# Patient Record
Sex: Male | Born: 1960 | Race: White | Hispanic: No | Marital: Married | Smoking: Never smoker
Health system: Southern US, Community
[De-identification: ages and names within clinical notes are randomized; demographics above are authoritative.]

## PROBLEM LIST (undated history)

## (undated) DIAGNOSIS — N201 Calculus of ureter: Secondary | ICD-10-CM

---

## 2007-09-28 ENCOUNTER — Emergency Department: Payer: Self-pay | Admitting: Unknown Physician Specialty

## 2008-05-22 ENCOUNTER — Emergency Department: Payer: Self-pay | Admitting: Emergency Medicine

## 2009-10-06 IMAGING — CT CT ABD-PELV W/O CM
1 of 2 series · 16 of 32 positions shown, 20 images · non-contrast
Comparison: none

REASON FOR EXAM: Left flank pain
COMMENTS:

PROCEDURE:     CT  - CT ABDOMEN AND PELVIS W[DATE] [DATE]
RESULT:     The patient has a history of flank pain.
TECHNIQUE: Nonenhanced CT of abdomen and pelvis is obtained.
Comparison is made to a prior study of 09/28/2007.

[Series 2: soft tissue · axial · 0.67mm/px · z∈[-1212,-786]mm · 16 of 154 slices shown, 20 images]
[im 6/154  soft-tissue]
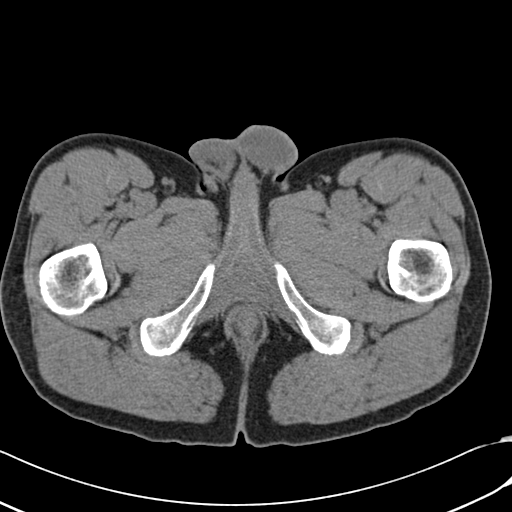
[im 6/154  bone]
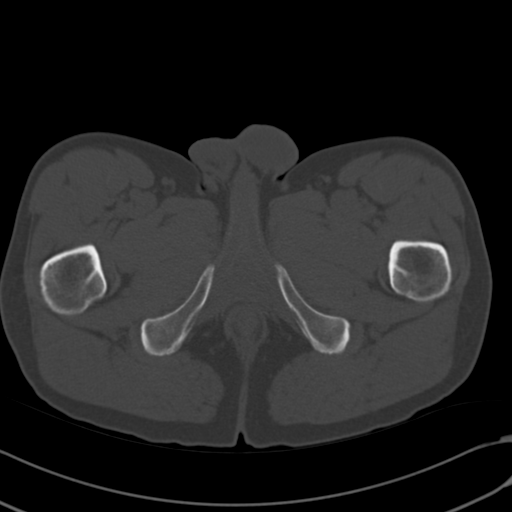
[im 18/154  soft-tissue]
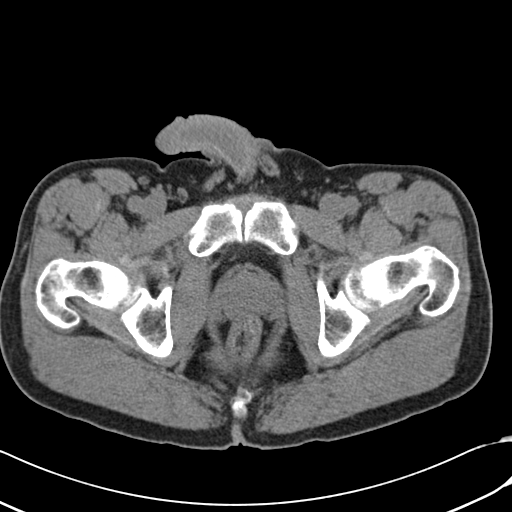
[im 29/154  soft-tissue]
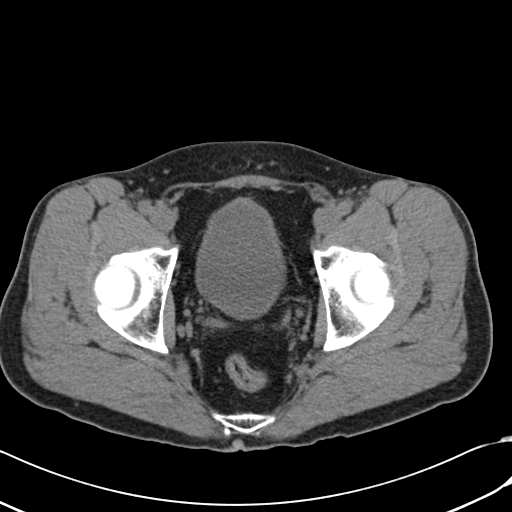
[im 40/154  soft-tissue]
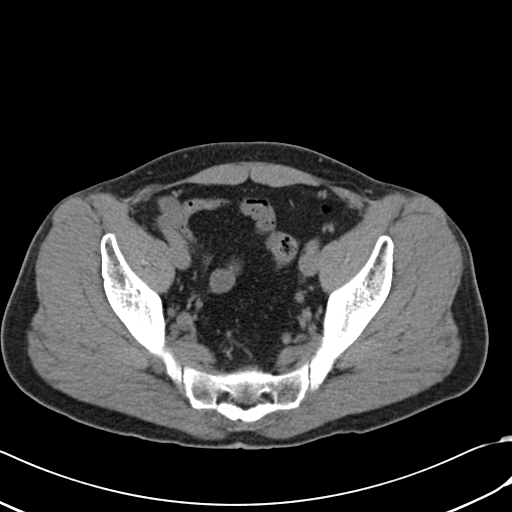
[im 52/154  soft-tissue]
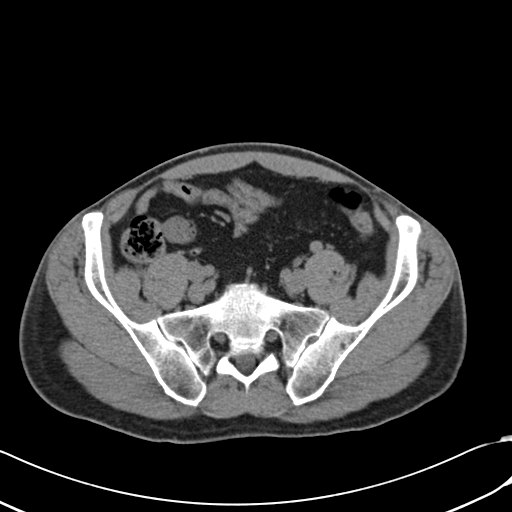
[im 63/154  soft-tissue]
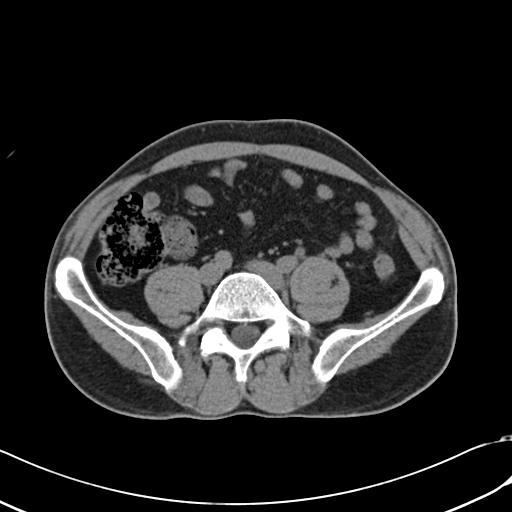
[im 74/154  soft-tissue]
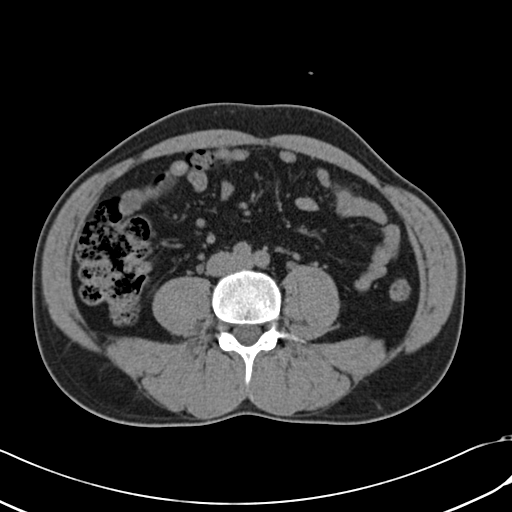
[im 80/154  soft-tissue]
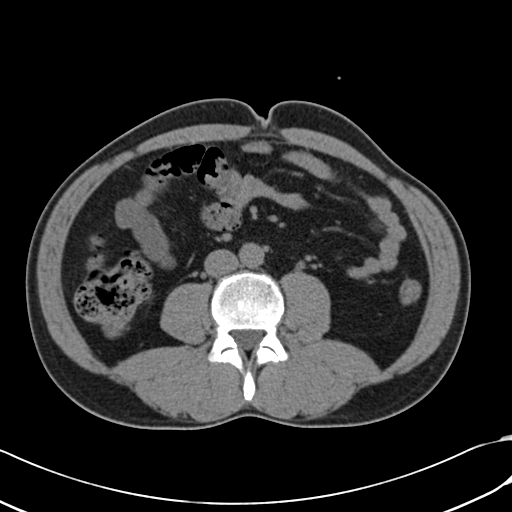
[im 91/154  soft-tissue]
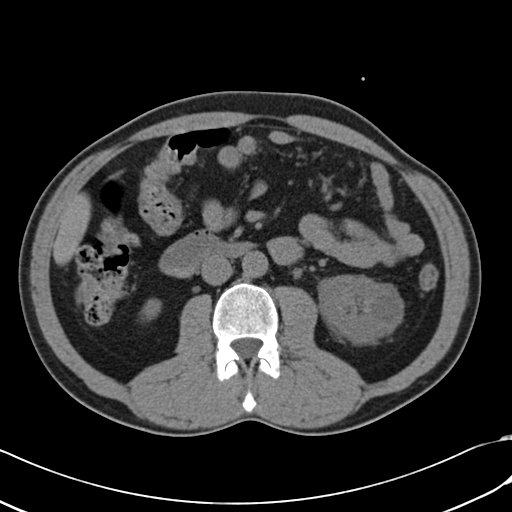
[im 91/154  bone]
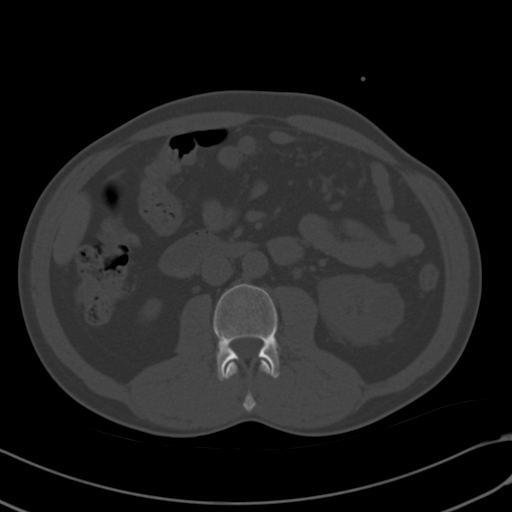
[im 103/154  soft-tissue]
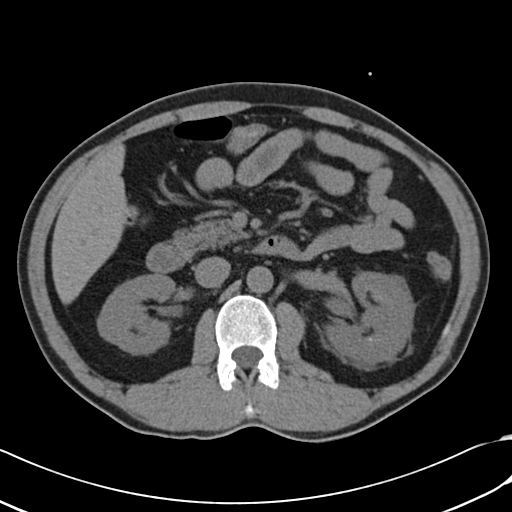
[im 114/154  soft-tissue]
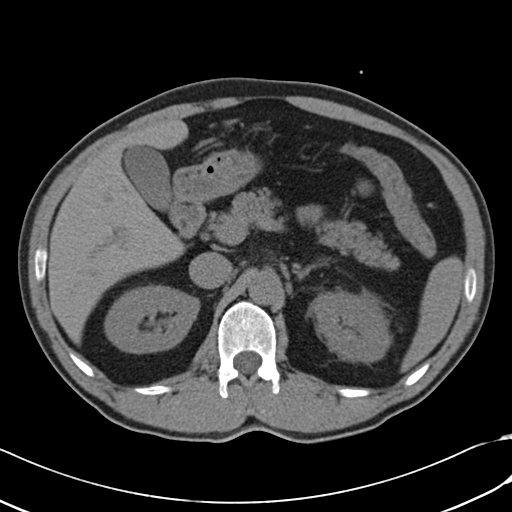
[im 125/154  soft-tissue]
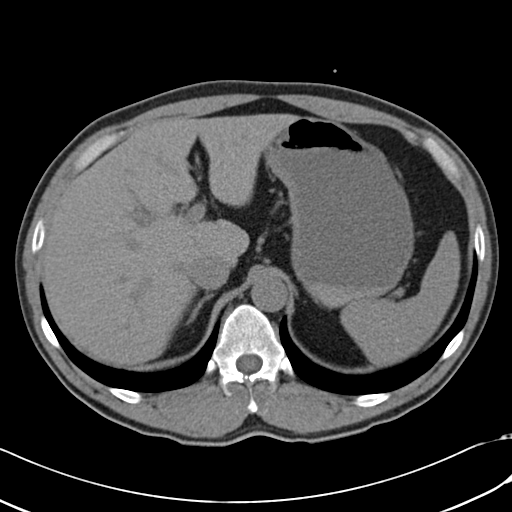
[im 131/154  lung]
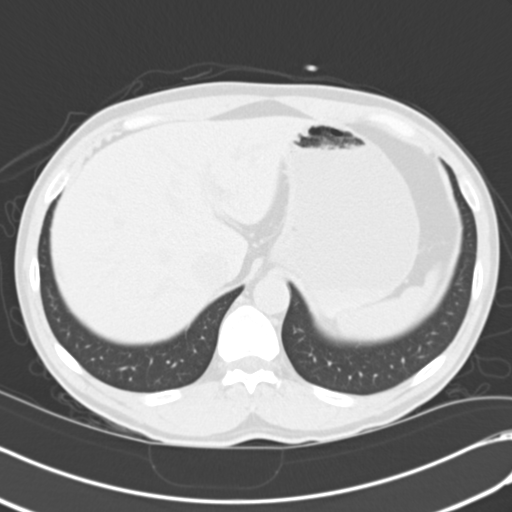
[im 137/154  soft-tissue]
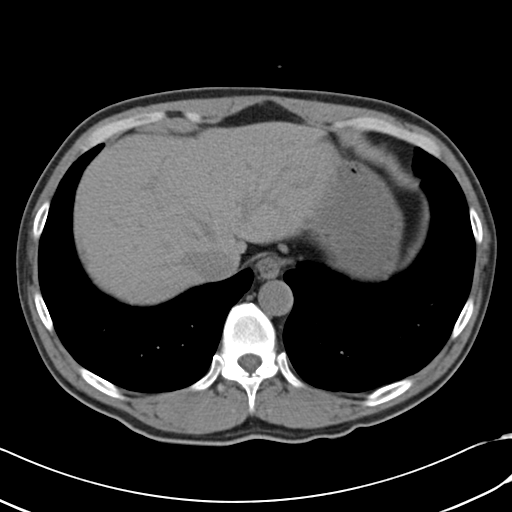
[im 137/154  lung]
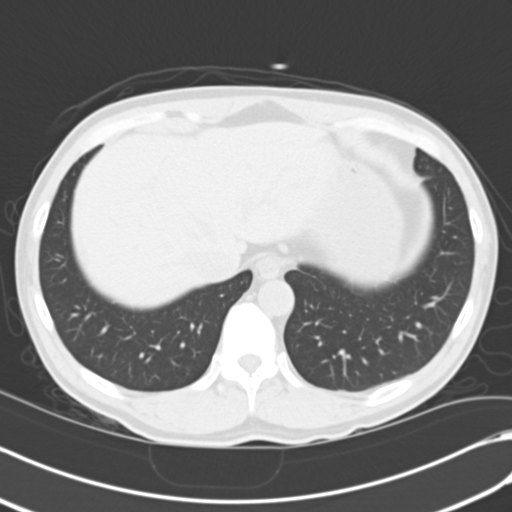
[im 142/154  lung]
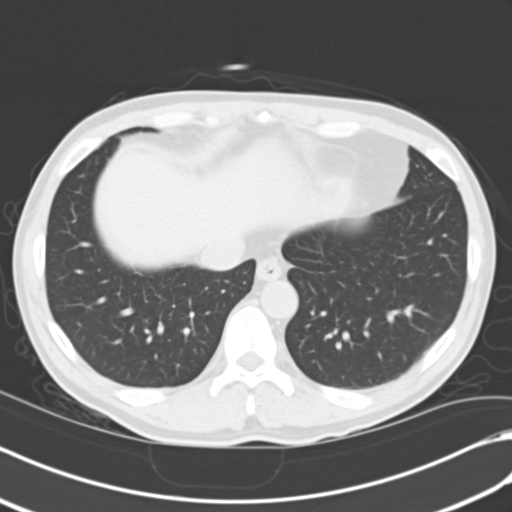
[im 148/154  soft-tissue]
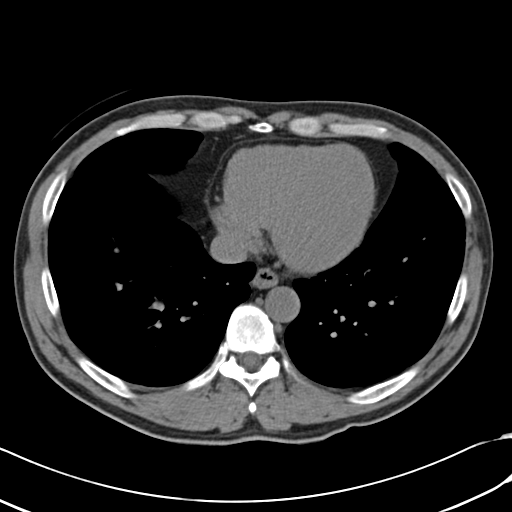
[im 148/154  lung]
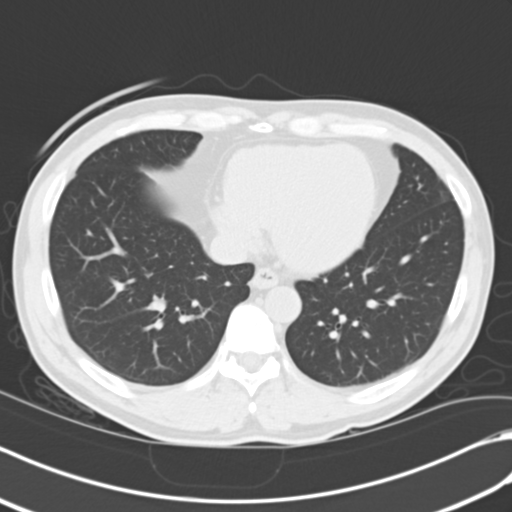

[16 of 32 positions shown; findings below may reference images not displayed]

FINDINGS: The liver and spleen are normal. The pancreas is normal. The
gallbladder is nondistended. The adrenals are normal. The RIGHT kidney is
unremarkable. There is LEFT hydronephrosis and hydroureter to the level of
the ureterovesical junction at which point there is a tiny, sand-like,
mm stone. Perirenal mild streaking is noted on the LEFT. A simple, LEFT
renal cyst is noted. This is present on a prior study of 09/28/2007. The
abdominal aorta is unremarkable. The appendiceal region is normal. A
calcified granuloma on the RIGHT is noted.
IMPRESSION: Tiny, 1.0 to 2.0 mm stone in the distal, LEFT ureter at the
LEFT ureterovesical junction.

## 2010-02-04 ENCOUNTER — Ambulatory Visit: Payer: Self-pay | Admitting: Internal Medicine

## 2016-08-08 ENCOUNTER — Observation Stay
Admission: EM | Admit: 2016-08-08 | Discharge: 2016-08-08 | Disposition: A | Discharge status: Home / Self Care | Payer: Medicaid Other | Attending: Internal Medicine | Admitting: Internal Medicine

## 2016-08-08 ENCOUNTER — Encounter: Payer: Self-pay | Admitting: *Deleted

## 2016-08-08 ENCOUNTER — Emergency Department: Payer: Medicaid Other

## 2016-08-08 DIAGNOSIS — D72829 Elevated white blood cell count, unspecified: Secondary | ICD-10-CM | POA: Diagnosis not present

## 2016-08-08 DIAGNOSIS — R1110 Vomiting, unspecified: Secondary | ICD-10-CM

## 2016-08-08 DIAGNOSIS — R109 Unspecified abdominal pain: Secondary | ICD-10-CM

## 2016-08-08 DIAGNOSIS — N201 Calculus of ureter: Secondary | ICD-10-CM | POA: Diagnosis present

## 2016-08-08 DIAGNOSIS — N179 Acute kidney failure, unspecified: Secondary | ICD-10-CM | POA: Diagnosis not present

## 2016-08-08 DIAGNOSIS — Z882 Allergy status to sulfonamides status: Secondary | ICD-10-CM | POA: Insufficient documentation

## 2016-08-08 DIAGNOSIS — N132 Hydronephrosis with renal and ureteral calculous obstruction: Secondary | ICD-10-CM | POA: Diagnosis not present

## 2016-08-08 DIAGNOSIS — Z87442 Personal history of urinary calculi: Secondary | ICD-10-CM | POA: Insufficient documentation

## 2016-08-08 DIAGNOSIS — E872 Acidosis, unspecified: Secondary | ICD-10-CM

## 2016-08-08 HISTORY — DX: Calculus of ureter: N20.1

## 2016-08-08 LAB — CBC
HEMATOCRIT: 47.3 % (ref 40.0–52.0)
HEMOGLOBIN: 16.4 g/dL (ref 13.0–18.0)
MCH: 30.4 pg (ref 26.0–34.0)
MCHC: 34.7 g/dL (ref 32.0–36.0)
MCV: 87.5 fL (ref 80.0–100.0)
Platelets: 272 10*3/uL (ref 150–440)
RBC: 5.41 MIL/uL (ref 4.40–5.90)
RDW: 12.3 % (ref 11.5–14.5)
WBC: 17.8 10*3/uL — AB (ref 3.8–10.6)

## 2016-08-08 LAB — URINALYSIS COMPLETE WITH MICROSCOPIC (ARMC ONLY)
Bacteria, UA: NONE SEEN
Bilirubin Urine: NEGATIVE
GLUCOSE, UA: NEGATIVE mg/dL
Hgb urine dipstick: NEGATIVE
Leukocytes, UA: NEGATIVE
Nitrite: NEGATIVE
PROTEIN: NEGATIVE mg/dL
SPECIFIC GRAVITY, URINE: 1.011 (ref 1.005–1.030)
SQUAMOUS EPITHELIAL / LPF: NONE SEEN
pH: 8 (ref 5.0–8.0)

## 2016-08-08 LAB — COMPREHENSIVE METABOLIC PANEL
ALK PHOS: 81 U/L (ref 38–126)
ALT: 14 U/L — AB (ref 17–63)
AST: 26 U/L (ref 15–41)
Albumin: 4.7 g/dL (ref 3.5–5.0)
Anion gap: 11 (ref 5–15)
BUN: 24 mg/dL — AB (ref 6–20)
CALCIUM: 9.7 mg/dL (ref 8.9–10.3)
CO2: 21 mmol/L — AB (ref 22–32)
CREATININE: 1.6 mg/dL — AB (ref 0.61–1.24)
Chloride: 102 mmol/L (ref 101–111)
GFR calc non Af Amer: 47 mL/min — ABNORMAL LOW (ref >60)
GFR, EST AFRICAN AMERICAN: 55 mL/min — AB (ref >60)
Glucose, Bld: 122 mg/dL — ABNORMAL HIGH (ref 65–99)
Potassium: 4 mmol/L (ref 3.5–5.1)
SODIUM: 134 mmol/L — AB (ref 135–145)
Total Bilirubin: 1.1 mg/dL (ref 0.3–1.2)
Total Protein: 8 g/dL (ref 6.5–8.1)

## 2016-08-08 MED ORDER — ONDANSETRON HCL 4 MG/2ML IJ SOLN: 4.0000 mg | Freq: Four times a day (QID) | INTRAMUSCULAR | Status: DC | PRN

## 2016-08-08 MED ORDER — ACETAMINOPHEN 500 MG PO TABS
1000.0000 mg | ORAL_TABLET | Freq: Once | ORAL | Status: AC
  Administered 2016-08-08: 1000 mg via ORAL
  Filled 2016-08-08: qty 2

## 2016-08-08 MED ORDER — ACETAMINOPHEN 650 MG RE SUPP: 650.0000 mg | Freq: Four times a day (QID) | RECTAL | Status: DC | PRN

## 2016-08-08 MED ORDER — SODIUM CHLORIDE 0.9 % IV BOLUS (SEPSIS)
1000.0000 mL | Freq: Once | INTRAVENOUS | Status: AC
  Administered 2016-08-08: 1000 mL via INTRAVENOUS

## 2016-08-08 MED ORDER — MORPHINE SULFATE (PF) 2 MG/ML IV SOLN: 2.0000 mg | INTRAVENOUS | Status: DC | PRN

## 2016-08-08 MED ORDER — ENOXAPARIN SODIUM 40 MG/0.4ML ~~LOC~~ SOLN: 30.0000 mg | SUBCUTANEOUS | Status: DC

## 2016-08-08 MED ORDER — DOCUSATE SODIUM 100 MG PO CAPS: 100.0000 mg | ORAL_CAPSULE | Freq: Two times a day (BID) | ORAL | Status: DC

## 2016-08-08 MED ORDER — PROMETHAZINE HCL 12.5 MG PO TABS: 12.5000 mg | ORAL_TABLET | Freq: Four times a day (QID) | ORAL | 0 refills | Status: AC | PRN

## 2016-08-08 MED ORDER — ACETAMINOPHEN 325 MG PO TABS: 650.0000 mg | ORAL_TABLET | Freq: Four times a day (QID) | ORAL | Status: DC | PRN

## 2016-08-08 MED ORDER — POLYETHYLENE GLYCOL 3350 17 G PO PACK: 17.0000 g | PACK | Freq: Every day | ORAL | Status: DC | PRN

## 2016-08-08 MED ORDER — HYDROMORPHONE HCL 1 MG/ML IJ SOLN
1.0000 mg | INTRAMUSCULAR | Status: DC | PRN
  Administered 2016-08-08: 1 mg via INTRAVENOUS
  Filled 2016-08-08: qty 1

## 2016-08-08 MED ORDER — OXYCODONE HCL 5 MG PO TABS: 5.0000 mg | ORAL_TABLET | Freq: Three times a day (TID) | ORAL | 0 refills | Status: AC | PRN

## 2016-08-08 MED ORDER — MORPHINE SULFATE (PF) 4 MG/ML IV SOLN
4.0000 mg | INTRAVENOUS | Status: DC | PRN
  Administered 2016-08-08: 4 mg via INTRAVENOUS
  Filled 2016-08-08: qty 1

## 2016-08-08 MED ORDER — PROMETHAZINE HCL 25 MG RE SUPP: 25.0000 mg | Freq: Four times a day (QID) | RECTAL | 0 refills | Status: DC | PRN

## 2016-08-08 MED ORDER — PROMETHAZINE HCL 25 MG/ML IJ SOLN
12.5000 mg | Freq: Once | INTRAMUSCULAR | Status: AC
  Administered 2016-08-08: 12.5 mg via INTRAVENOUS
  Filled 2016-08-08: qty 1

## 2016-08-08 MED ORDER — ALBUTEROL SULFATE (2.5 MG/3ML) 0.083% IN NEBU: 2.5000 mg | INHALATION_SOLUTION | RESPIRATORY_TRACT | Status: DC | PRN

## 2016-08-08 MED ORDER — HYDROCODONE-ACETAMINOPHEN 5-325 MG PO TABS: 1.0000 | ORAL_TABLET | ORAL | Status: DC | PRN

## 2016-08-08 MED ORDER — ACETAMINOPHEN 10 MG/ML IV SOLN: 1000.0000 mg | Freq: Four times a day (QID) | INTRAVENOUS | Status: DC

## 2016-08-08 MED ORDER — ONDANSETRON HCL 4 MG PO TABS: 4.0000 mg | ORAL_TABLET | Freq: Four times a day (QID) | ORAL | Status: DC | PRN

## 2016-08-08 MED ORDER — TAMSULOSIN HCL 0.4 MG PO CAPS: 0.4000 mg | ORAL_CAPSULE | Freq: Every day | ORAL | 0 refills | Status: DC

## 2016-08-08 MED ORDER — ONDANSETRON HCL 4 MG/2ML IJ SOLN
4.0000 mg | Freq: Once | INTRAMUSCULAR | Status: AC
  Administered 2016-08-08: 4 mg via INTRAVENOUS
  Filled 2016-08-08: qty 2

## 2016-08-08 MED ORDER — SENNA 8.6 MG PO TABS: 1.0000 | ORAL_TABLET | Freq: Two times a day (BID) | ORAL | Status: DC

## 2016-08-08 NOTE — ED Provider Notes (Addendum)
[facility] Emergency Department Provider Note    First MD Initiated Contact with Patient 08/08/16 [phone]     (approximate)  I have reviewed the triage vital signs and the nursing notes.   HISTORY  Chief Complaint Flank Pain    HPI Samuel Kelley is a 55 y.o. male with reported history of kidney stones presents with acute onset constant left crampy flank pain that started last night at 7 PM while he was at home. Denies any fevers. Has had nausea and vomiting associated with this pain. States that the pain does not migrate in her left flank. He has not noticed any rash. He denies any shortness of breath or chest pain. Denies any pain in his legs. Denies any diarrhea or blood in stools.   PMH: kidney stones  There are no active problems to display for this patient.   No past surgical history.  Prior to Admission medications   Not on File    Allergies Sulfa antibiotics  FMH: no bleeding disorders  Social History Social History  Substance Use Topics  . Smoking status: Not on file  . Smokeless tobacco: Not on file  . Alcohol use Not on file    Review of Systems Patient denies headaches, rhinorrhea, blurry vision, numbness, shortness of breath, chest pain, edema, cough, abdominal pain, nausea, vomiting, diarrhea, dysuria, fevers, rashes or hallucinations unless otherwise stated above in HPI. ____________________________________________   PHYSICAL EXAM:  VITAL SIGNS: Vitals:   08/08/16 0831  BP: (!) 140/91  Pulse: 78  Resp: 18  Temp: 98.8 F (37.1 C)    Constitutional: Alert and oriented.ill appearing Eyes: Conjunctivae are normal. PERRL. EOMI. Head: Atraumatic. Nose: No congestion/rhinnorhea. Mouth/Throat: Mucous membranes are moist.  Oropharynx non-erythematous. Neck: No stridor. Painless ROM. No cervical spine tenderness to palpation Hematological/Lymphatic/Immunilogical: No cervical lymphadenopathy. Cardiovascular: Normal  rate, regular rhythm. Grossly normal heart sounds.  Good peripheral circulation. Respiratory: Normal respiratory effort.  No retractions. Lungs CTAB. Gastrointestinal: Soft and nontender. No distention. No abdominal bruits. Genitourinary: left CVA ttpMusculoskeletal: No lower extremity tenderness nor edema.  No joint effusions. Neurologic:  Normal speech and language. No gross focal neurologic deficits are appreciated. No gait instability. Skin:  Skin is warm, dry and intact. No rash noted. Psychiatric: Mood and affect are normal. Speech and behavior are normal.  ____________________________________________   LABS (all labs ordered are listed, but only abnormal results are displayed)  Results for orders placed or performed during the hospital encounter of 08/08/16 (from the past 24 hour(s))  CBC     Status: Abnormal   Collection Time: 08/08/16  8:31 AM  Result Value Ref Range   WBC 17.8 (H) 3.8 - 10.6 K/uL   RBC 5.41 4.40 - 5.90 MIL/uL   Hemoglobin 16.4 13.0 - 18.0 g/dL   HCT 13.0 86.5 - 78.4 %   MCV 87.5 80.0 - 100.0 fL   MCH 30.4 26.0 - 34.0 pg   MCHC 34.7 32.0 - 36.0 g/dL   RDW 69.6 29.5 - 28.4 %   Platelets 272 150 - 440 K/uL  Comprehensive metabolic panel     Status: Abnormal   Collection Time: 08/08/16  8:31 AM  Result Value Ref Range   Sodium 134 (L) 135 - 145 mmol/L   Potassium 4.0 3.5 - 5.1 mmol/L   Chloride 102 101 - 111 mmol/L   CO2 21 (L) 22 - 32 mmol/L   Glucose, Bld 122 (H) 65 - 99 mg/dL   BUN 24 (H) 6 -  20 mg/dL   Creatinine, Ser 1.611.60 (H) 0.61 - 1.24 mg/dL   Calcium 9.7 8.9 - 09.610.3 mg/dL   Total Protein 8.0 6.5 - 8.1 g/dL   Albumin 4.7 3.5 - 5.0 g/dL   AST 26 15 - 41 U/L   ALT 14 (L) 17 - 63 U/L   Alkaline Phosphatase 81 38 - 126 U/L   Total Bilirubin 1.1 0.3 - 1.2 mg/dL   GFR calc non Af Amer 47 (L) >60 mL/min   GFR calc Af Amer 55 (L) >60 mL/min   Anion gap 11 5 - 15  Urinalysis complete, with microscopic ([facility] only)     Status: Abnormal   Collection  Time: 08/08/16 10:03 AM  Result Value Ref Range   Color, Urine STRAW (A) YELLOW   APPearance CLEAR (A) CLEAR   Glucose, UA NEGATIVE NEGATIVE mg/dL   Bilirubin Urine NEGATIVE NEGATIVE   Ketones, ur 1+ (A) NEGATIVE mg/dL   Specific Gravity, Urine 1.011 1.005 - 1.030   Hgb urine dipstick NEGATIVE NEGATIVE   pH 8.0 5.0 - 8.0   Protein, ur NEGATIVE NEGATIVE mg/dL   Nitrite NEGATIVE NEGATIVE   Leukocytes, UA NEGATIVE NEGATIVE   RBC / HPF 0-5 0 - 5 RBC/hpf   WBC, UA 0-5 0 - 5 WBC/hpf   Bacteria, UA NONE SEEN NONE SEEN   Squamous Epithelial / LPF NONE SEEN NONE SEEN   ____________________________________________   ____________________________________________  RADIOLOGY  CT abdomen with 2mm obstructing left UVJ stone ____________________________________________   PROCEDURES  Procedure(s) performed: none    Critical Care performed: no ____________________________________________   INITIAL IMPRESSION / ASSESSMENT AND PLAN / ED COURSE  Pertinent labs & imaging results that were available during my care of the patient were reviewed by me and considered in my medical decision making (see chart for details).  DDX: stone, pyelo, colitis, msk strain  Samuel Kelley is a 55 y.o. who presents to the ED with acute left flank pain associated with nausea vomiting. Patient. Uncomfortable appearing. His abdominal exam is soft and benign. Based on his history of stones will order laboratory evaluation as well as CT imaging to evaluate for acute obstructive uropathy.  Clinical Course  Comment By Time  Patient reassessed. Patient states pain is improved. Discussed results with patient. Does have marked leukocytosis. Also with evidence of a KI that appears to be post obstructive uropathy. Patient also with a metabolic acidosis. Will order another IV fluid bolus bolus for resuscitation. Willy EddyPatrick Gaby Harney, MD 08/26 [phone]  Patient reassessed and requesting to be discharged home.  Dr Elpidio AnisSudini and  I wanted to bedside to evaluate patient. Currently hemodynamic stable and afebrile. His symptoms have improved as far as pain but the patient is still complaining of nausea. Further recommended admission to the hospital patient declined. Patient demonstrated understanding that his condition could worsen including worsening pain worsening renal renal function worsening dehydration. Patient was encouraged to return to the ER should he return request further evaluation and assistance.  Have discussed with the patient and available family all diagnostics and treatments performed thus far and all questions were answered to the best of my ability. The patient demonstrates understanding and agreement with plan.  Willy EddyPatrick Rawlins Stuard, MD 08/26 1148   ----------------------------------------- 10:57 AM on 08/08/2016 -----------------------------------------  Patient reassessed and still having nausea and vomiting. We'll reduce antinausea medications. Patient ordered an additional IV fluid bolus. His urine does not show evidence of infection. Based on presentation do feel patient will require admission for further  symptomatic management and treatment of his acute kidney injury.  ____________________________________________   FINAL CLINICAL IMPRESSION(S) / ED DIAGNOSES  Final diagnoses:  Flank pain  Ureterolithiasis  Intractable vomiting with nausea, vomiting of unspecified type  AKI (acute kidney injury) (HCC)  Metabolic acidosis      NEW MEDICATIONS STARTED DURING THIS VISIT:  New Prescriptions   No medications on file     Note:  This document was prepared using Dragon voice recognition software and may include unintentional dictation errors.    Willy Eddy, MD 08/08/16 1058    Willy Eddy, MD 08/08/16 1150

## 2016-08-08 NOTE — H&P (Signed)
[facility] [place] [facility]   PATIENT NAME: Samuel ArRodney Carlton    MR#:  [id number]  DATE OF BIRTH:  12-Nov-1961  DATE OF ADMISSION:  08/08/2016  PRIMARY CARE PHYSICIAN: No PCP Per Patient   REQUESTING/REFERRING PHYSICIAN: Dr. Roxan Hockeyobinson  CHIEF COMPLAINT:   Chief Complaint  Patient presents with  . Flank Pain    HISTORY OF PRESENT ILLNESS:  Samuel Kelley  is a 55 y.o. male with a known history of Ureteral stones 3 years back presents to the emergency room complaining of one week off on and off left flank and back spasmodic pain which has acutely worsened in the last 24 hours. He has been unable to keep any food or pills down. Afebrile. Urinalysis shows no UTI. Acute kidney injury. CT scan of the abdomen showed left UVJ 2 mm stone with left hydronephrosis.  Patient is being admitted for IV fluids, IV pain medications and antiemetics. Unable to keep pills down.  PAST MEDICAL HISTORY:   Past Medical History:  Diagnosis Date  . Left ureteral stone     PAST SURGICAL HISTORY:  No past surgical history on file.  SOCIAL HISTORY:   Social History  Substance Use Topics  . Smoking status: Not on file  . Smokeless tobacco: Not on file  . Alcohol use Not on file   Doesn't smoke or drink alcohol.  FAMILY HISTORY:  History reviewed. No pertinent family history.  No DM, CAD DRUG ALLERGIES:   Allergies  Allergen Reactions  . Sulfa Antibiotics Other (See Comments)    Severe abdominal pain.    REVIEW OF SYSTEMS:   ROS  MEDICATIONS AT HOME:   Prior to Admission medications   Not on File     VITAL SIGNS:  Blood pressure (!) 140/91, pulse 78, temperature 98.8 F (37.1 C), temperature source Oral, resp. rate 18, height 5\' 8"  (1.727 m), weight 68.9 kg (152 lb), SpO2 100 %.  PHYSICAL EXAMINATION:  Physical Exam  GENERAL:  55 y.o.-year-old patient lying in the bed with no acute distress.  EYES: Pupils equal, round, reactive to light and  accommodation. No scleral icterus. Extraocular muscles intact.  HEENT: Head atraumatic, normocephalic. Oropharynx and nasopharynx clear. No oropharyngeal erythema, dry oral mucosa  NECK:  Supple, no jugular venous distention. No thyroid enlargement, no tenderness.  LUNGS: Normal breath sounds bilaterally, no wheezing, rales, rhonchi. No use of accessory muscles of respiration.  CARDIOVASCULAR: S1, S2 normal. No murmurs, rubs, or gallops.  ABDOMEN: Soft, nondistended. Bowel sounds present. No organomegaly or mass. Left flank and CVA tenderness EXTREMITIES: No pedal edema, cyanosis, or clubbing. + 2 pedal & radial pulses b/l.   NEUROLOGIC: Cranial nerves II through XII are intact. No focal Motor or sensory deficits appreciated b/l PSYCHIATRIC: The patient is alert and oriented x 3. Good affect.  SKIN: No obvious rash, lesion, or ulcer.   LABORATORY PANEL:   CBC  Recent Labs Lab 08/08/16 0831  WBC 17.8*  HGB 16.4  HCT 47.3  PLT 272   ------------------------------------------------------------------------------------------------------------------  Chemistries   Recent Labs Lab 08/08/16 0831  NA 134*  K 4.0  CL 102  CO2 21*  GLUCOSE 122*  BUN 24*  CREATININE 1.60*  CALCIUM 9.7  AST 26  ALT 14*  ALKPHOS 81  BILITOT 1.1   ------------------------------------------------------------------------------------------------------------------  Cardiac Enzymes No results for input(s): TROPONINI in the last 168 hours. ------------------------------------------------------------------------------------------------------------------  RADIOLOGY:  Ct Renal Stone Study  Result Date: 08/08/2016 CLINICAL DATA:  Left-sided pain beginning last  night EXAM: CT ABDOMEN AND PELVIS WITHOUT CONTRAST TECHNIQUE: Multidetector CT imaging of the abdomen and pelvis was performed following the standard protocol without IV contrast. COMPARISON:  05/22/2008 FINDINGS: Lower chest: Lung bases are clear.  No effusions. Heart is normal size. Hepatobiliary: No focal hepatic abnormality. Gallbladder unremarkable. Pancreas: No focal abnormality or ductal dilatation. Spleen: Scattered calcifications compatible with old granulomatous disease. Normal size. Adrenals/Urinary Tract: Mild to moderate left hydronephrosis. Extensive perinephric stranding on the left. This is due to a 2 mm left UVJ stone. No stones or hydronephrosis on the right. Urinary bladder and adrenal glands have an unremarkable appearance. Stomach/Bowel: Few scattered left colonic diverticula. No active diverticulitis. Stomach and small bowel decompressed, unremarkable. Appendix is normal. Vascular/Lymphatic: No evidence of aneurysm or adenopathy. Reproductive: No visible focal abnormality. Other: No free fluid or free air. Musculoskeletal: No acute bony abnormality or focal bone lesion. IMPRESSION: 2 mm left UVJ stone with moderate left hydronephrosis and extensive perinephric stranding. Electronically Signed   By: Charlett Nose M.D.   On: 08/08/2016 09:20     IMPRESSION AND PLAN:   * Left UVJ 2 mm stone with hydronephrosis Patient does have mild elevation of creatinine which could be acute kidney injury or CKD. The stone will likely pass with symptomatic treatment and IV fluids. Patient has needed multiple doses of IV antiemetics and chronic pain medications for symptom control. Admit for IV fluids and IV pain medications. Consult urology. Urinalysis shows no signs of UTI  * Acute kidney injury Start IV fluids. Monitor input and output. Repeat labs in the morning.  * Leukocytosis Likely from stress and hemoconcentration. We will repeat labs in the morning after hydration.  * DVT prophylaxis with Lovenox  All the records are reviewed and case discussed with ED provider. Management plans discussed with the patient, family and they are in agreement.  CODE STATUS: FULL CODE  TOTAL TIME TAKING CARE OF THIS PATIENT: 40 minutes.    Milagros Loll R M.D on 08/08/2016 at 11:13 AM  Between 7am to 6pm - Pager - [phone]  After 6pm go to [email or web address] - password [email or web address] [facility]  [facility]  Office  [phone]  CC: Primary care physician; No PCP Per Patient  Note: This dictation was prepared with Dragon dictation along with smaller phrase technology. Any transcriptional errors that result from this process are unintentional.

## 2016-08-08 NOTE — ED Notes (Signed)
EDP at bedside  

## 2016-08-08 NOTE — ED Triage Notes (Signed)
States vomiting and left flank pain that began last night, states hx of kidney stones, states sense of urinary urgency

## 2017-12-23 IMAGING — CT CT RENAL STONE PROTOCOL
2 of 4 series · 16 of 46 positions shown, 18 images · non-contrast
Comparison: 05/22/2008

CLINICAL DATA: Left-sided pain beginning last night

EXAM:
CT ABDOMEN AND PELVIS WITHOUT CONTRAST
TECHNIQUE: Multidetector CT imaging of the abdomen and pelvis was performed
following the standard protocol without IV contrast.

[Series 2: axial st · axial · 0.69mm/px · z∈[-986,-591]mm · 13 of 87 slices shown, 15 images]
[im 4/87  soft-tissue]
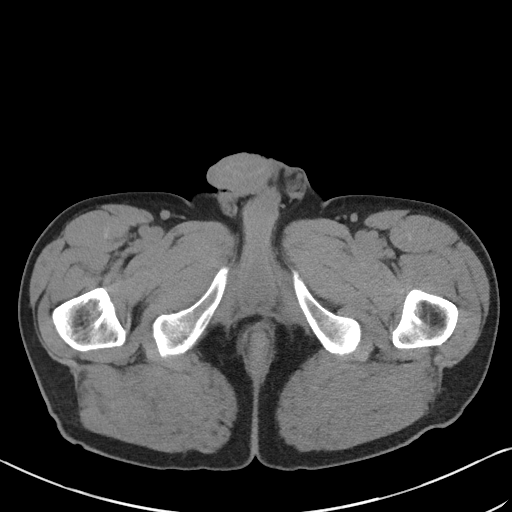
[im 4/87  bone]
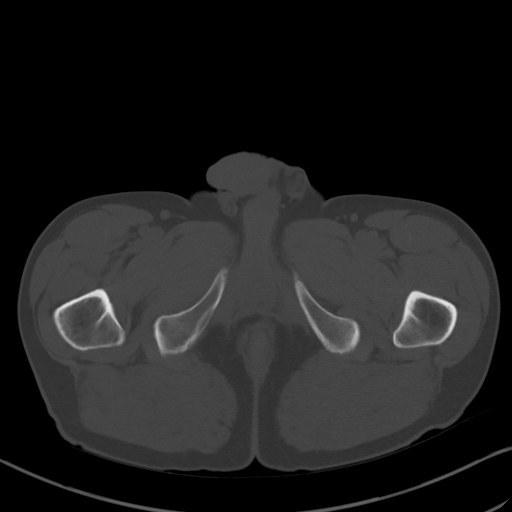
[im 11/87  soft-tissue]
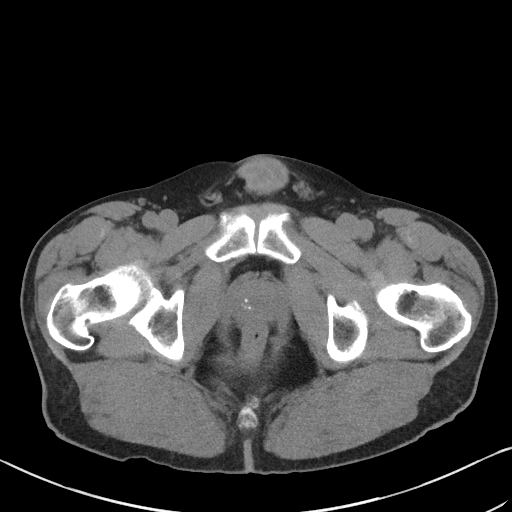
[im 18/87  soft-tissue]
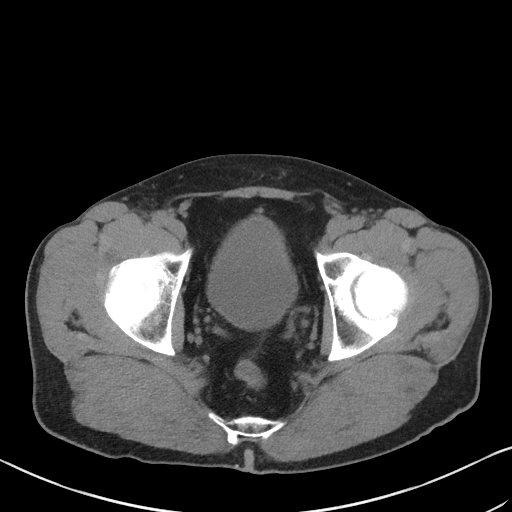
[im 25/87  soft-tissue]
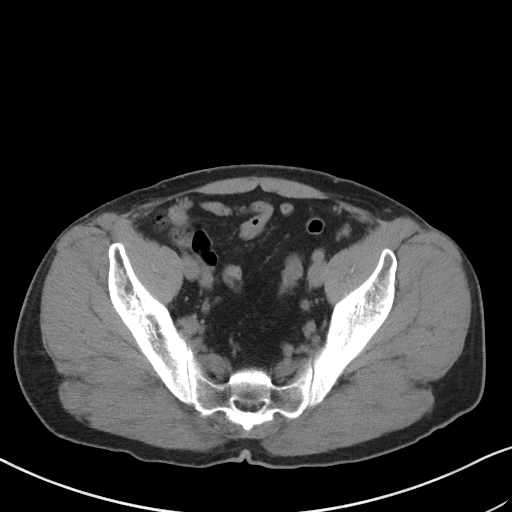
[im 31/87  soft-tissue]
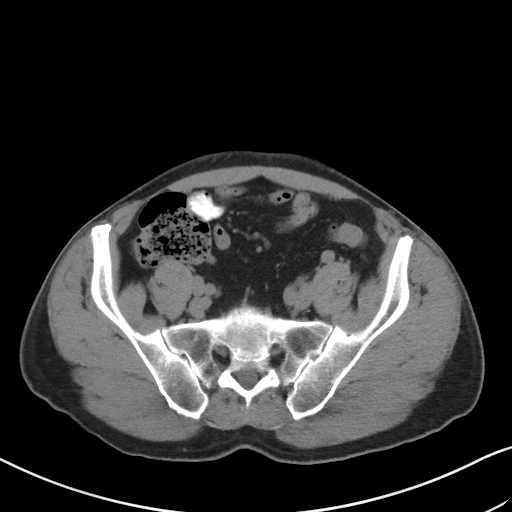
[im 38/87  soft-tissue]
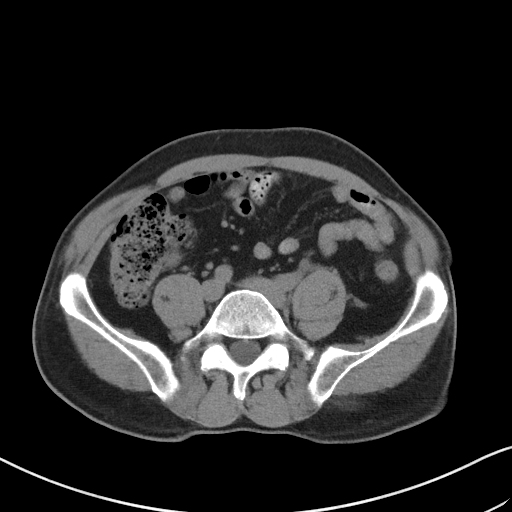
[im 45/87  soft-tissue]
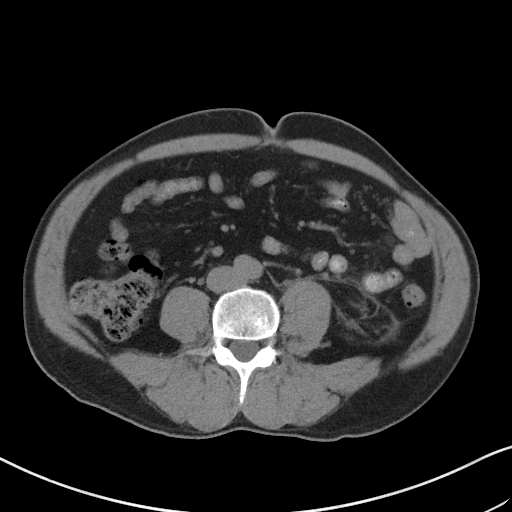
[im 49/87  soft-tissue]
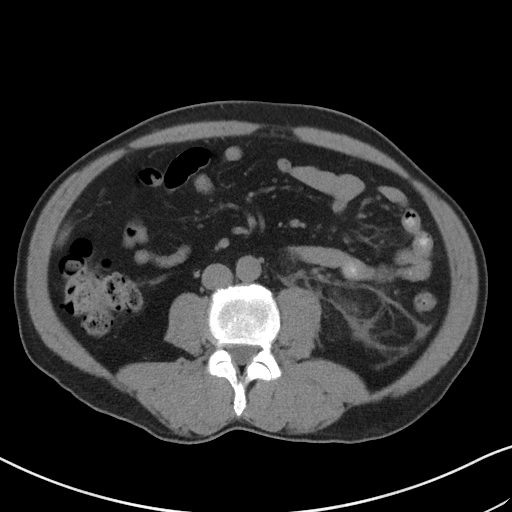
[im 56/87  soft-tissue]
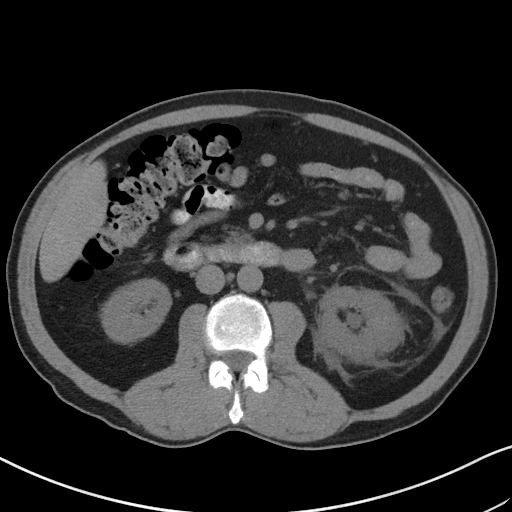
[im 56/87  bone]
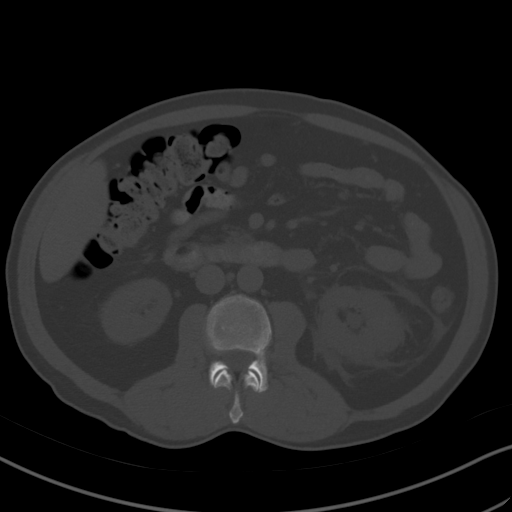
[im 62/87  soft-tissue]
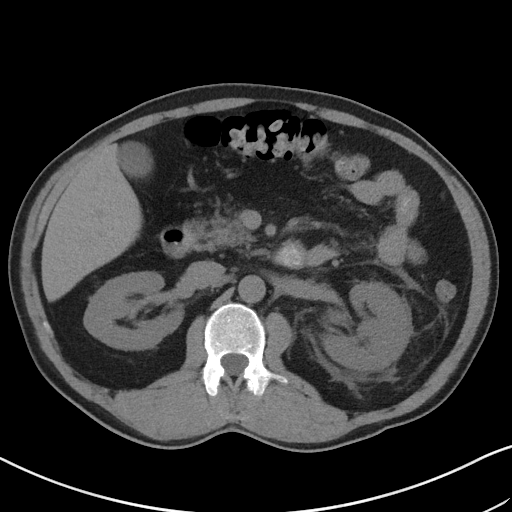
[im 69/87  soft-tissue]
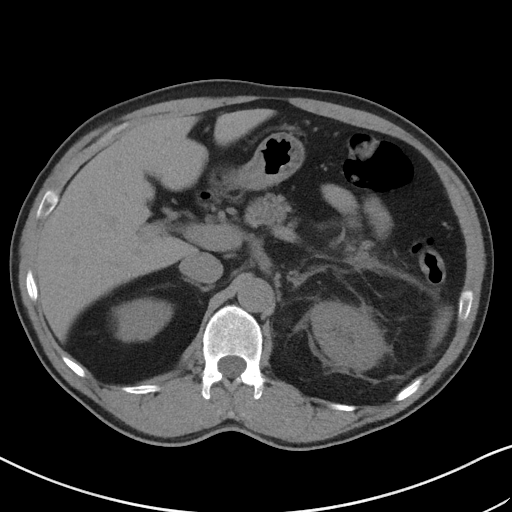
[im 76/87  soft-tissue]
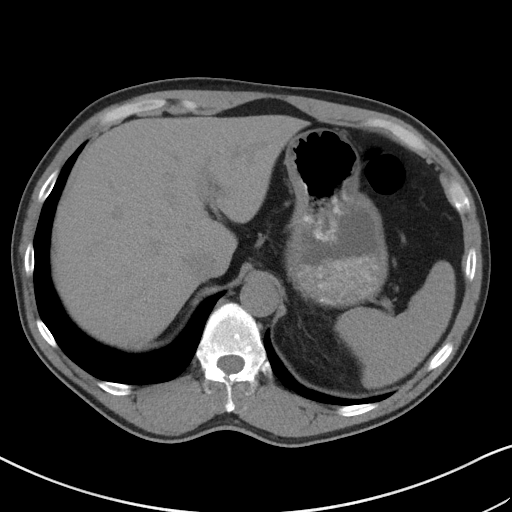
[im 83/87  soft-tissue]
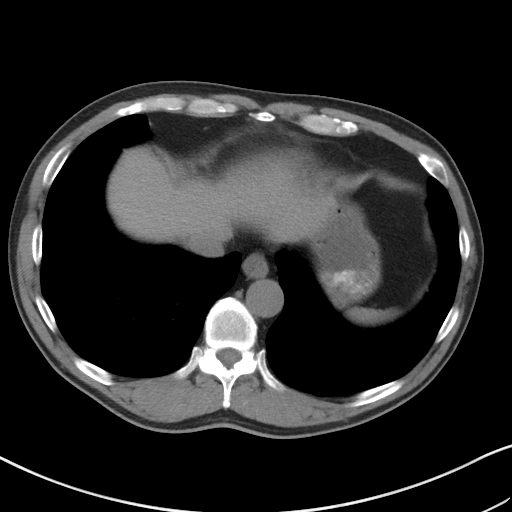

[Series 4: coronal st · coronal · 0.68mm/px · 3 of 84 slices shown]
[im 28/84  soft-tissue]
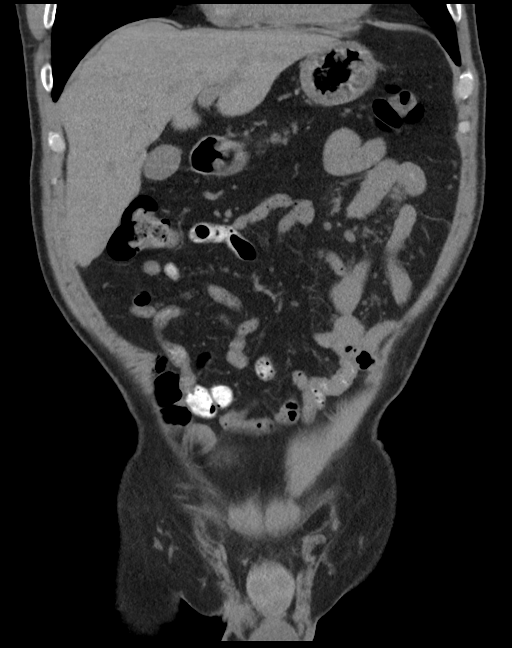
[im 37/84  soft-tissue]
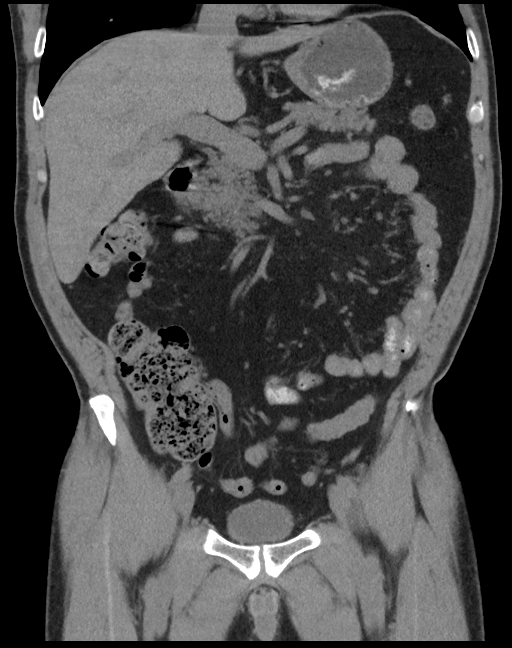
[im 47/84  soft-tissue]
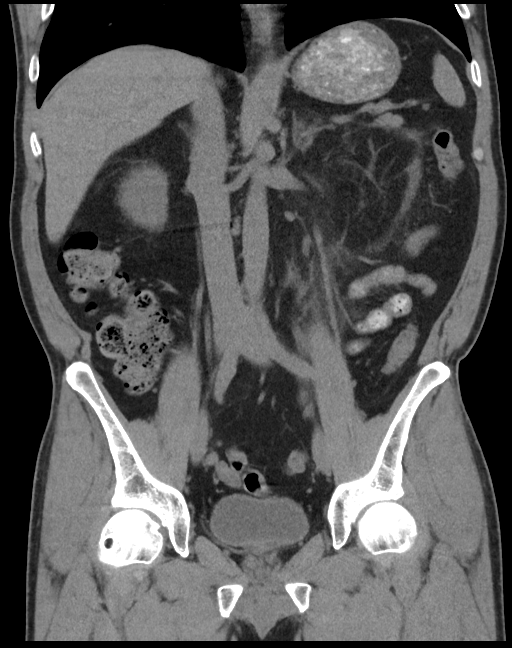

[16 of 46 positions shown; findings below may reference images not displayed]

FINDINGS: Lower chest: Lung bases are clear. No effusions. Heart is normal
size.

Hepatobiliary: No focal hepatic abnormality. Gallbladder
unremarkable.

Pancreas: No focal abnormality or ductal dilatation.

Spleen: Scattered calcifications compatible with old granulomatous
disease. Normal size.

Adrenals/Urinary Tract: Mild to moderate left hydronephrosis.
Extensive perinephric stranding on the left. This is due to a 2 mm
left UVJ stone. No stones or hydronephrosis on the right. Urinary
bladder and adrenal glands have an unremarkable appearance.

Stomach/Bowel: Few scattered left colonic diverticula. No active
diverticulitis. Stomach and small bowel decompressed, unremarkable.
Appendix is normal.

Vascular/Lymphatic: No evidence of aneurysm or adenopathy.

Reproductive: No visible focal abnormality.

Other: No free fluid or free air.

Musculoskeletal: No acute bony abnormality or focal bone lesion.
IMPRESSION: 2 mm left UVJ stone with moderate left hydronephrosis and extensive
perinephric stranding.

## 2019-11-16 ENCOUNTER — Emergency Department: Payer: BLUE CROSS/BLUE SHIELD

## 2019-11-16 ENCOUNTER — Emergency Department
Admission: EM | Admit: 2019-11-16 | Discharge: 2019-11-16 | Disposition: A | Discharge status: Home / Self Care | Payer: BLUE CROSS/BLUE SHIELD | Attending: Emergency Medicine | Admitting: Emergency Medicine

## 2019-11-16 ENCOUNTER — Other Ambulatory Visit: Payer: Self-pay

## 2019-11-16 ENCOUNTER — Encounter: Payer: Self-pay | Admitting: Emergency Medicine

## 2019-11-16 DIAGNOSIS — S93402A Sprain of unspecified ligament of left ankle, initial encounter: Secondary | ICD-10-CM | POA: Diagnosis not present

## 2019-11-16 DIAGNOSIS — Y999 Unspecified external cause status: Secondary | ICD-10-CM | POA: Diagnosis not present

## 2019-11-16 DIAGNOSIS — X501XXA Overexertion from prolonged static or awkward postures, initial encounter: Secondary | ICD-10-CM | POA: Diagnosis not present

## 2019-11-16 DIAGNOSIS — M25579 Pain in unspecified ankle and joints of unspecified foot: Secondary | ICD-10-CM

## 2019-11-16 DIAGNOSIS — Y929 Unspecified place or not applicable: Secondary | ICD-10-CM | POA: Insufficient documentation

## 2019-11-16 DIAGNOSIS — S99912A Unspecified injury of left ankle, initial encounter: Secondary | ICD-10-CM | POA: Diagnosis present

## 2019-11-16 DIAGNOSIS — Y939 Activity, unspecified: Secondary | ICD-10-CM | POA: Insufficient documentation

## 2019-11-16 MED ORDER — IBUPROFEN 600 MG PO TABS: 600.0000 mg | ORAL_TABLET | Freq: Four times a day (QID) | ORAL | 0 refills | Status: AC | PRN

## 2019-11-16 MED ORDER — OXYCODONE-ACETAMINOPHEN 5-325 MG PO TABS
1.0000 | ORAL_TABLET | Freq: Once | ORAL | Status: DC
  Filled 2019-11-16: qty 1

## 2019-11-16 MED ORDER — OXYCODONE-ACETAMINOPHEN 5-325 MG PO TABS: 1.0000 | ORAL_TABLET | Freq: Three times a day (TID) | ORAL | 0 refills | Status: AC | PRN

## 2019-11-16 NOTE — ED Provider Notes (Signed)
[facility] Emergency Department Provider Note  ____________________________________________  Time seen: Approximately 4:12 PM  I have reviewed the triage vital signs and the nursing notes.   HISTORY  Chief Complaint Ankle Injury    HPI Samuel Kelley is a 58 y.o. male that presents to the emergency department for evaluation of right lateral ankle pain after injury today.  Patient was stepping out of his truck when his ankle twisted and he heard a pop.  He is been unable to bear weight since injury.  He does have some intermittent tingling to his foot.  No additional injuries.  Past Medical History:  Diagnosis Date  . Left ureteral stone     Patient Active Problem List   Diagnosis Date Noted  . Ureteral stone 08/08/2016    History reviewed. No pertinent surgical history.  Prior to Admission medications   Medication Sig Start Date End Date Taking? Authorizing Provider  ibuprofen (ADVIL) 600 MG tablet Take 1 tablet (600 mg total) by mouth every 6 (six) hours as needed. 11/16/19   Enid Derry, PA-C  oxyCODONE-acetaminophen (PERCOCET) 5-325 MG tablet Take 1 tablet by mouth every 8 (eight) hours as needed for up to 2 days for severe pain. 11/16/19 11/18/19  Enid Derry, PA-C  promethazine (PHENERGAN) 12.5 MG tablet Take 1 tablet (12.5 mg total) by mouth every 6 (six) hours as needed for nausea or vomiting. 08/08/16   Willy Eddy, MD    Allergies Sulfa antibiotics  No family history on file.  Social History Social History   Tobacco Use  . Smoking status: Never Smoker  . Smokeless tobacco: Never Used  Substance Use Topics  . Alcohol use: Not on file  . Drug use: Not on file     Review of Systems  Respiratory: No SOB. Gastrointestinal: No nausea, no vomiting.  Musculoskeletal: Positive for foot pain. Skin: Negative for rash, abrasions, lacerations, ecchymosis. Neurological: Positive for intermittent  tingling.   ____________________________________________   PHYSICAL EXAM:  VITAL SIGNS: ED Triage Vitals  Enc Vitals Group     BP 11/16/19 1442 (!) 149/96     Pulse Rate 11/16/19 1442 80     Resp 11/16/19 1442 16     Temp 11/16/19 1442 98.2 F (36.8 C)     Temp src --      SpO2 11/16/19 1442 100 %     Weight 11/16/19 1438 151 lb 14.4 oz (68.9 kg)     Height --      Head Circumference --      Peak Flow --      Pain Score 11/16/19 1437 5     Pain Loc --      Pain Edu? --      Excl. in GC? --      Constitutional: Alert and oriented. Well appearing and in no acute distress. Eyes: Conjunctivae are normal. PERRL. EOMI. Head: Atraumatic. ENT:      Ears:      Nose: No congestion/rhinnorhea.      Mouth/Throat: Mucous membranes are moist.  Neck: No stridor. Cardiovascular: Normal rate, regular rhythm.  Good peripheral circulation.  Symmetric pedal pulses bilaterally.  Cap refill less than 3 seconds. Respiratory: Normal respiratory effort without tachypnea or retractions. Lungs CTAB. Good air entry to the bases with no decreased or absent breath sounds. Musculoskeletal: Full range of motion to all extremities. No gross deformities appreciated.  Welling to lateral malleolus. Neurologic:  Normal speech and language. No gross focal neurologic deficits are appreciated.  Sensation intact to lower extremities bilaterally. Skin:  Skin is warm, dry and intact. No rash noted. Psychiatric: Mood and affect are normal. Speech and behavior are normal. Patient exhibits appropriate insight and judgement.   ____________________________________________   LABS (all labs ordered are listed, but only abnormal results are displayed)  Labs Reviewed - No data to display ____________________________________________  EKG   ____________________________________________  RADIOLOGY Lexine BatonI, Elaiza Shoberg, personally viewed and evaluated these images (plain radiographs) as part of my medical decision  making, as well as reviewing the written report by the radiologist.  Dg Ankle Complete Right  Result Date: 11/16/2019 CLINICAL DATA:  Ankle pain and swelling EXAM: RIGHT ANKLE - COMPLETE 3+ VIEW COMPARISON:  None. FINDINGS: Lateral soft tissue swelling. No fracture or malalignment. Ankle mortise is symmetric. There is an ankle effusion. IMPRESSION: No acute osseous abnormality. Electronically Signed   By: Jasmine PangKim  Fujinaga M.D.   On: 11/16/2019 16:35   Dg Foot Complete Right  Result Date: 11/16/2019 CLINICAL DATA:  Right foot injury.  Pain. EXAM: RIGHT FOOT COMPLETE - 3+ VIEW COMPARISON:  No recent prior. FINDINGS: No acute bony or joint abnormality identified. No evidence of fracture dislocation. Degenerative changes first MTP joint. Soft tissue swelling. No radiopaque foreign body. IMPRESSION: Soft tissue swelling. Degenerative changes first MTP joint. No acute abnormality identified. Electronically Signed   By: Maisie Fushomas  Register   On: 11/16/2019 15:36    ____________________________________________    PROCEDURES  Procedure(s) performed:    Procedures    Medications  oxyCODONE-acetaminophen (PERCOCET/ROXICET) 5-325 MG per tablet 1 tablet (1 tablet Oral Refused 11/16/19 1622)     ____________________________________________   INITIAL IMPRESSION / ASSESSMENT AND PLAN / ED COURSE  Pertinent labs & imaging results that were available during my care of the patient were reviewed by me and considered in my medical decision making (see chart for details).  Review of the [place] CSRS was performed in accordance of the [facility] prior to dispensing any controlled drugs.   Patient's diagnosis is consistent with ankle sprain.  Vital signs and exam are reassuring.  X-rays are negative for acute bony abnormalities.  Ankle splint was placed.  Crutches were given.  Patient was given a dose of Percocet for pain.  Patient will be discharged home with prescriptions for ibuprofen and a short course of  percocet. Patient is to follow up with ortho as directed. Referral was given to Dr. Ernest PineHooten. Patient is given ED precautions to return to the ED for any worsening or new symptoms.   Marily LenteRodney W Brosnahan was evaluated in Emergency Department on 11/16/2019 for the symptoms described in the history of present illness. He was evaluated in the context of the global COVID-19 pandemic, which necessitated consideration that the patient might be at risk for infection with the SARS-CoV-2 virus that causes COVID-19. Institutional protocols and algorithms that pertain to the evaluation of patients at risk for COVID-19 are in a state of rapid change based on information released by regulatory bodies including the CDC and federal and state organizations. These policies and algorithms were followed during the patient's care in the ED.  ____________________________________________  FINAL CLINICAL IMPRESSION(S) / ED DIAGNOSES  Final diagnoses:  Ankle pain  Sprain of left ankle, unspecified ligament, initial encounter      NEW MEDICATIONS STARTED DURING THIS VISIT:  ED Discharge Orders         Ordered    ibuprofen (ADVIL) 600 MG tablet  Every 6 hours PRN     11/16/19 1712  oxyCODONE-acetaminophen (PERCOCET) 5-325 MG tablet  Every 8 hours PRN     11/16/19 1712              This chart was dictated using voice recognition software/Dragon. Despite best efforts to proofread, errors can occur which can change the meaning. Any change was purely unintentional.    Laban Emperor, PA-C 11/16/19 1856    Arta Silence, MD 11/16/19 2208

## 2019-11-16 NOTE — ED Triage Notes (Signed)
[address] when getting out of truck in a parking lot, rolled right ankle over and hit a drain.  C/O right ankle pain.

## 2019-11-16 NOTE — ED Notes (Signed)
See triage note.  States he jumped out of truck  Landed on right foot  Twisted ankle  Felt a pop  Positive swelling   Good pulses

## 2021-04-01 IMAGING — CR DG FOOT COMPLETE 3+V*R*
1 series · 3 of 3 positions shown · non-contrast
Comparison: No recent prior.

CLINICAL DATA: Right foot injury.  Pain.

EXAM:
RIGHT FOOT COMPLETE - 3+ VIEW

[Series 1: dg foot complete right · 0.14mm/px · 3 of 3 slices shown]
[im 1/3]
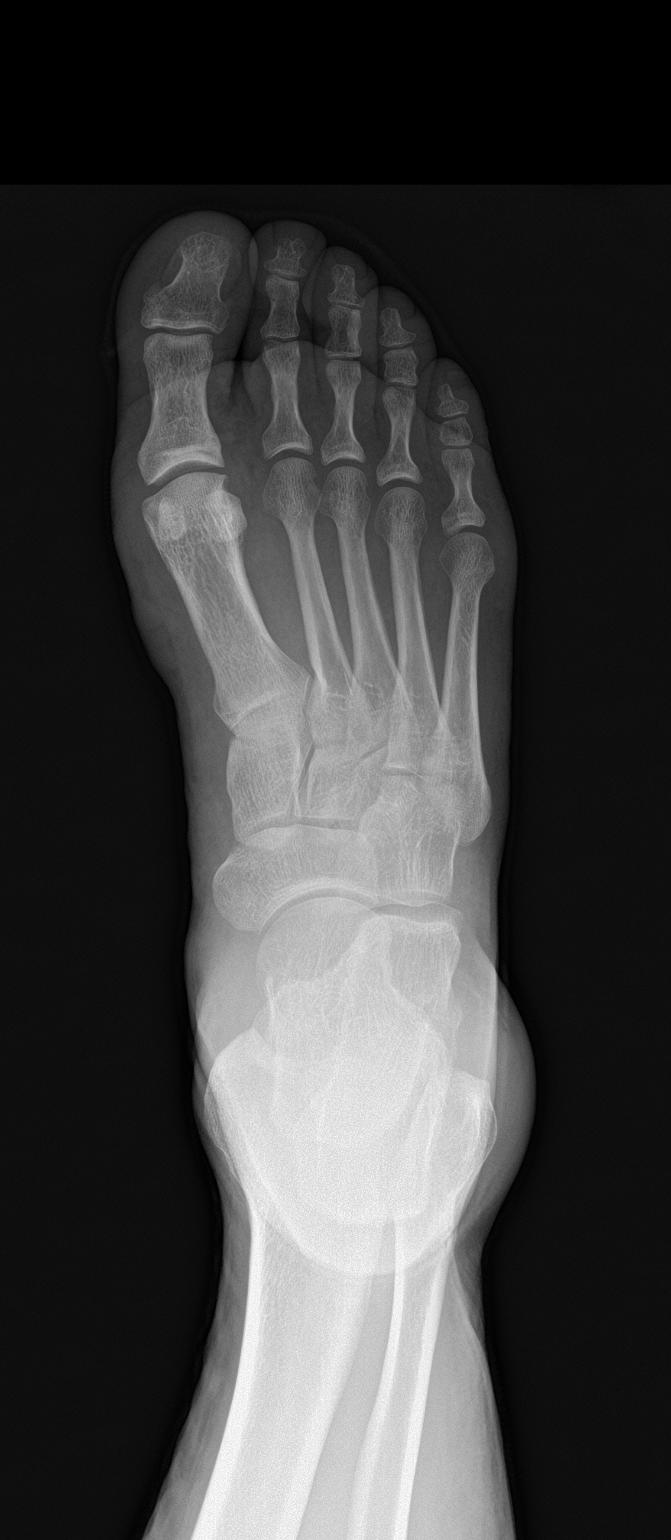
[im 2/3]
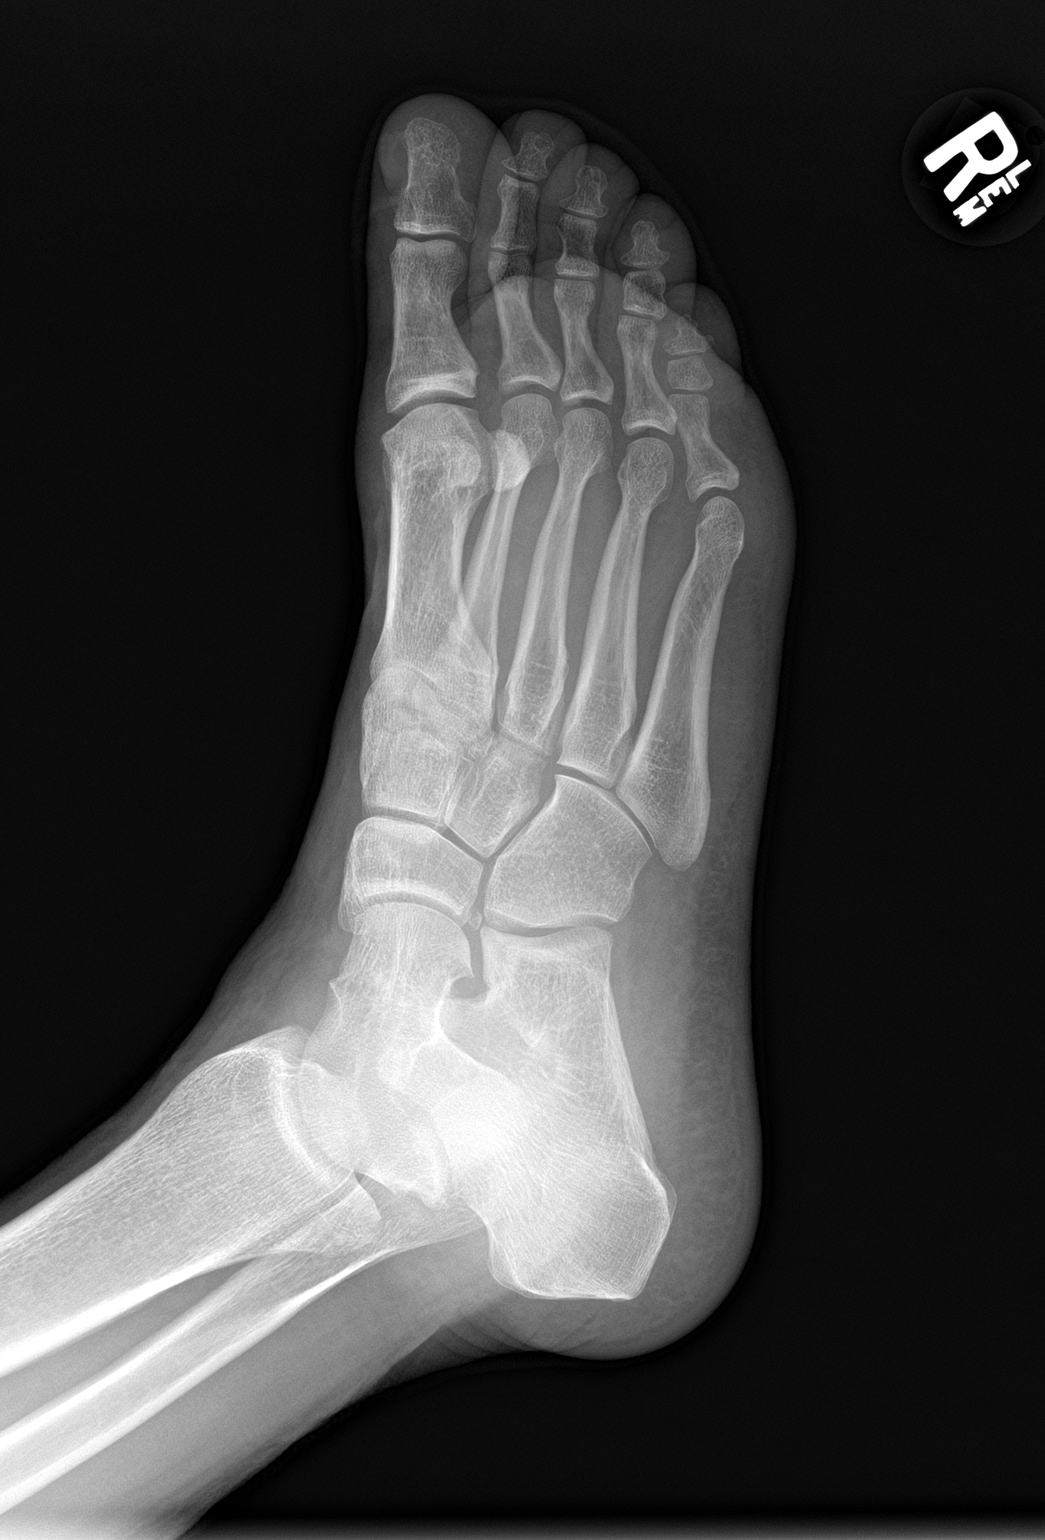
[im 3/3]
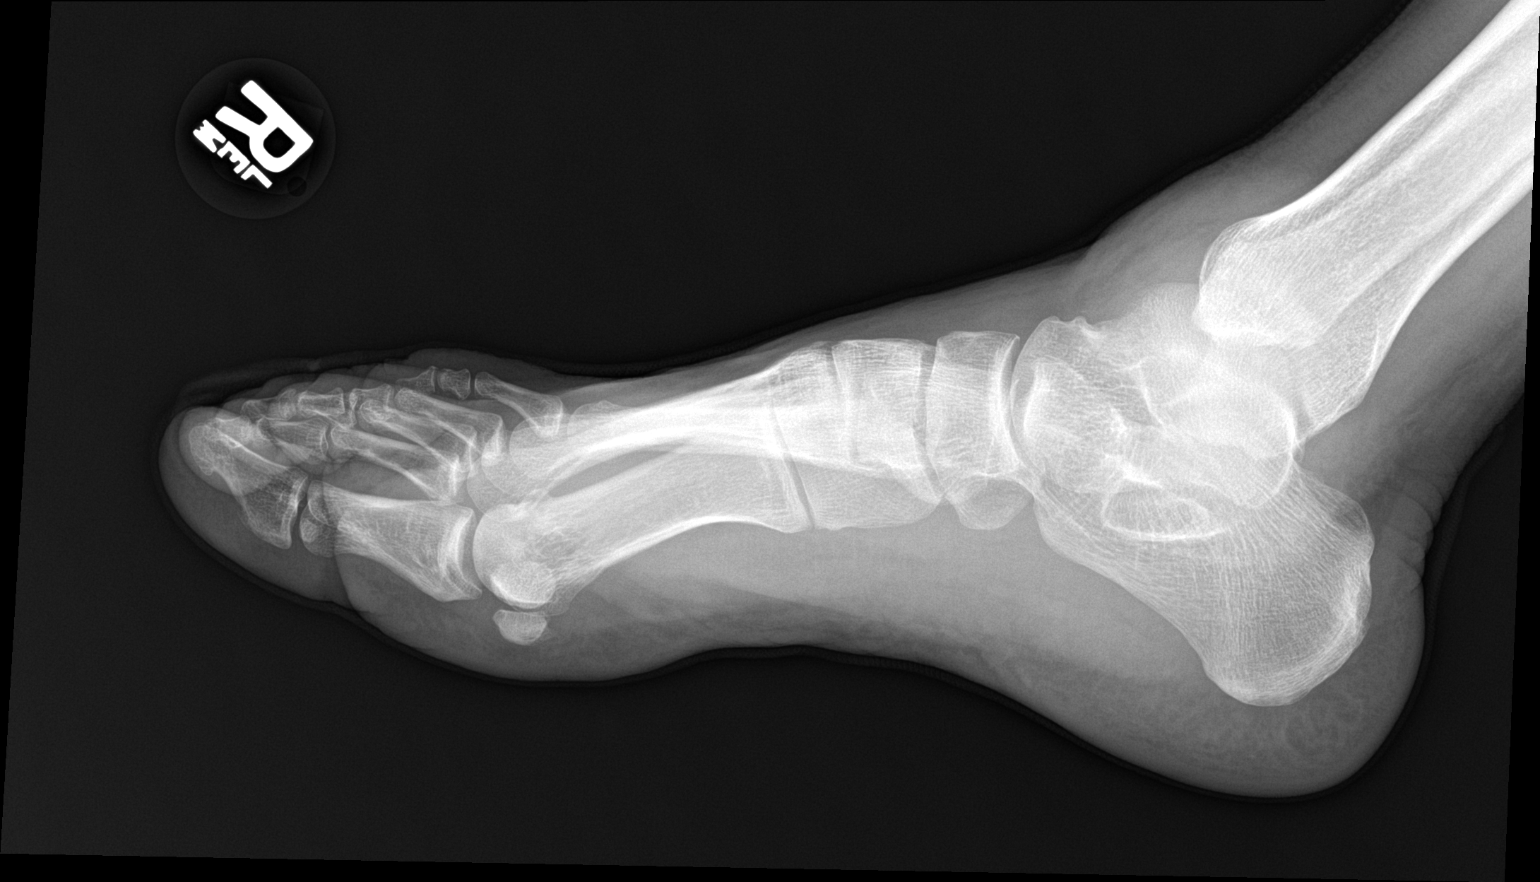

[3 of 3 positions shown; findings below may reference images not displayed]

FINDINGS: No acute bony or joint abnormality identified. No evidence of
fracture dislocation. Degenerative changes first MTP joint. Soft
tissue swelling. No radiopaque foreign body.
IMPRESSION: Soft tissue swelling. Degenerative changes first MTP joint. No acute
abnormality identified.
